# Patient Record
Sex: Male | Born: 1995 | Race: Black or African American | Hispanic: No | Marital: Single | Smoking: Current some day smoker
Health system: Southern US, Community
[De-identification: ages and names within clinical notes are randomized; demographics above are authoritative.]

## PROBLEM LIST (undated history)

## (undated) ENCOUNTER — Emergency Department: Admission: EM | Payer: Self-pay | Source: Home / Self Care

---
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
## 2015-05-19 ENCOUNTER — Emergency Department
Admission: EM | Admit: 2015-05-19 | Discharge: 2015-05-19 | Disposition: A | Discharge status: Home / Self Care | Payer: Self-pay | Attending: Emergency Medicine | Admitting: Emergency Medicine

## 2015-05-19 ENCOUNTER — Encounter: Payer: Self-pay | Admitting: *Deleted

## 2015-05-19 DIAGNOSIS — Z720 Tobacco use: Secondary | ICD-10-CM | POA: Insufficient documentation

## 2015-05-19 DIAGNOSIS — Z880 Allergy status to penicillin: Secondary | ICD-10-CM | POA: Insufficient documentation

## 2015-05-19 DIAGNOSIS — K529 Noninfective gastroenteritis and colitis, unspecified: Secondary | ICD-10-CM | POA: Insufficient documentation

## 2015-05-19 LAB — URINALYSIS COMPLETE WITH MICROSCOPIC (ARMC ONLY)
Bacteria, UA: NONE SEEN
Bilirubin Urine: NEGATIVE
GLUCOSE, UA: NEGATIVE mg/dL
HGB URINE DIPSTICK: NEGATIVE
KETONES UR: NEGATIVE mg/dL
Leukocytes, UA: NEGATIVE
NITRITE: NEGATIVE
Protein, ur: NEGATIVE mg/dL
SPECIFIC GRAVITY, URINE: 1.011 (ref 1.005–1.030)
Squamous Epithelial / LPF: NONE SEEN
WBC, UA: NONE SEEN WBC/hpf (ref 0–5)
pH: 8 (ref 5.0–8.0)

## 2015-05-19 NOTE — Discharge Instructions (Signed)
Viral Gastroenteritis Viral gastroenteritis is also called stomach flu. This illness is caused by a certain type of germ (virus). It can cause sudden watery poop (diarrhea) and throwing up (vomiting). This can cause you to lose body fluids (dehydration). This illness usually lasts for 3 to 8 days. It usually goes away on its own. HOME CARE   Drink enough fluids to keep your pee (urine) clear or pale yellow. Drink small amounts of fluids often.  Ask your doctor how to replace body fluid losses (rehydration).  Avoid:  Foods high in sugar.  Alcohol.  Bubbly (carbonated) drinks.  Tobacco.  Juice.  Caffeine drinks.  Very hot or cold fluids.  Fatty, greasy foods.  Eating too much at one time.  Dairy products until 24 to 48 hours after your watery poop stops.  You may eat foods with active cultures (probiotics). They can be found in some yogurts and supplements.  Wash your hands well to avoid spreading the illness.  Only take medicines as told by your doctor. Do not give aspirin to children. Do not take medicines for watery poop (antidiarrheals).  Ask your doctor if you should keep taking your regular medicines.  Keep all doctor visits as told. GET HELP RIGHT AWAY IF:   You cannot keep fluids down.  You do not pee at least once every 6 to 8 hours.  You are short of breath.  You see blood in your poop or throw up. This may look like coffee grounds.  You have belly (abdominal) pain that gets worse or is just in one small spot (localized).  You keep throwing up or having watery poop.  You have a fever.  The patient is a child younger than 3 months, and he or she has a fever.  The patient is a child older than 3 months, and he or she has a fever and problems that do not go away.  The patient is a child older than 3 months, and he or she has a fever and problems that suddenly get worse.  The patient is a baby, and he or she has no tears when crying. MAKE SURE YOU:     Understand these instructions.  Will watch your condition.  Will get help right away if you are not doing well or get worse. Document Released: 02/18/2008 Document Revised: 11/24/2011 Document Reviewed: 06/18/2011 [facility] Patient Information 2015 [place], [place]. This information is not intended to replace advice given to you by your health care provider. Make sure you discuss any questions you have with your health care provider.  Your exam and labs were normal today. Continue to monitor symptoms. Increase fluid intake to prevent dehydration.  Follow-up with the Health Department as needed.

## 2015-05-19 NOTE — ED Notes (Signed)
Patient states for last couple of days "I just haven't felt like myself, like I've been weak". Patient also reports lower back pain. States a few days ago [facility] and told them about some bladder area pain he was having. States he was tested for gonorrhea which was negative.

## 2015-05-20 NOTE — ED Provider Notes (Signed)
[facility] Emergency Department Provider Note ____________________________________________  Time seen: 1205  I have reviewed the triage vital signs and the nursing notes.  HISTORY  Chief Complaint  Weakness and Back Pain  HPI Brian Vega is a 19 y.o. male report to the ED with generalized complaints of malaise and fatigue. He states, "I just haven't felt like myself, like I've been weak." He denies any frank fevers, but does note over the last 2 days he's had decreased appetite, nausea without vomiting, as well as intermittent diarrhea, gas pain and general abdominal pain. He is unaware of any sick contacts, and denies recent travel or bad food. He reports negative gonorrhea test 2 weeks prior through the Health Department due to unrelated symptoms.  History reviewed. No pertinent past medical history.  There are no active problems to display for this patient.  History reviewed. No pertinent past surgical history.  No current outpatient prescriptions on file.  Allergies Amoxicillin  No family history on file.  Social History Social History  Substance Use Topics  . Smoking status: Current Some Day Smoker  . Smokeless tobacco: None  . Alcohol Use: No   Review of Systems  Constitutional: Negative for fever. Reports malaise as above. Eyes: Negative for visual changes. ENT: Negative for sore throat. Cardiovascular: Negative for chest pain. Respiratory: Negative for shortness of breath. Gastrointestinal: Positive for abdominal pain, vomiting and diarrhea. Genitourinary: Negative for dysuria. Musculoskeletal: Negative for back pain. Skin: Negative for rash. Neurological: Negative for headaches, focal weakness or numbness. ____________________________________________  PHYSICAL EXAM:  VITAL SIGNS: ED Triage Vitals  Enc Vitals Group     BP 05/19/15 1119 145/74 mmHg     Pulse Rate 05/19/15 1119 57     Resp 05/19/15 1119 17     Temp 05/19/15  1119 97.4 F (36.3 C)     Temp Source 05/19/15 1119 Oral     SpO2 05/19/15 1119 100 %     Weight 05/19/15 1119 145 lb (65.772 kg)     Height 05/19/15 1119 5\' 4"  (1.626 m)     Head Cir --      Peak Flow --      Pain Score 05/19/15 1120 5     Pain Loc --      Pain Edu? --      Excl. in GC? --    Constitutional: Alert and oriented. Well appearing and in no distress. Eyes: Conjunctivae are normal. PERRL. Normal extraocular movements. ENT   Head: Normocephalic and atraumatic.   Nose: No congestion/rhinnorhea.   Mouth/Throat: Mucous membranes are moist.   Neck: Supple. No thyromegaly. Hematological/Lymphatic/Immunilogical: No cervical lymphadenopathy. Cardiovascular: Normal rate, regular rhythm.  Respiratory: Normal respiratory effort. No wheezes/rales/rhonchi. Gastrointestinal: Soft and nontender. No distention, rebound, guarding, or organomegaly. GU: Normal circumcised male genitalia. Minimal left spermatic cord tenderness without edema, hernia, or testicular swelling.  Musculoskeletal: Nontender with normal range of motion in all extremities.  Neurologic:  Normal gait without ataxia. Normal speech and language. No gross focal neurologic deficits are appreciated. Skin:  Skin is warm, dry and intact. No rash noted. Psychiatric: Mood and affect are normal. Patient exhibits appropriate insight and judgment. ____________________________________________    LABS (pertinent positives/negatives) Labs Reviewed  URINALYSIS COMPLETEWITH MICROSCOPIC ([facility] ONLY) - Abnormal; Notable for the following:    Color, Urine YELLOW (*)    APPearance CLEAR (*)    All other components within normal limits  ____________________________________________  INITIAL IMPRESSION / ASSESSMENT AND PLAN / ED COURSE  Variable GI symptoms consistent with a likely viral gastroenteritis. Suggest patient continue to monitor symptoms, increased fluid intake, and rest as necessary. Suggest follow-up with the  [place] contacted him for ongoing symptoms or return to the ED for worsening symptoms. ____________________________________________  FINAL CLINICAL IMPRESSION(S) / ED DIAGNOSES  Final diagnoses:  Gastroenteritis, acute     Lissa Hoard, PA-C 05/20/15 1022  Jennye Moccasin, MD 05/23/15 [phone]

## 2019-07-09 ENCOUNTER — Other Ambulatory Visit: Payer: Self-pay

## 2019-07-09 DIAGNOSIS — H109 Unspecified conjunctivitis: Secondary | ICD-10-CM | POA: Insufficient documentation

## 2019-07-09 DIAGNOSIS — F17210 Nicotine dependence, cigarettes, uncomplicated: Secondary | ICD-10-CM | POA: Insufficient documentation

## 2019-07-09 NOTE — ED Notes (Signed)
Left eye:  20/25 Right eye:  20/40 Both  Eye:  20/30

## 2019-07-09 NOTE — ED Triage Notes (Signed)
Patient reports right eye pain for 3 days.  Sclera is noted to be red in right eye.

## 2019-07-10 ENCOUNTER — Emergency Department
Admission: EM | Admit: 2019-07-10 | Discharge: 2019-07-10 | Disposition: A | Discharge status: Home / Self Care | Payer: Self-pay | Attending: Emergency Medicine | Admitting: Emergency Medicine

## 2019-07-10 DIAGNOSIS — H109 Unspecified conjunctivitis: Secondary | ICD-10-CM

## 2019-07-10 MED ORDER — ERYTHROMYCIN 5 MG/GM OP OINT: 1.0000 "application " | TOPICAL_OINTMENT | Freq: Three times a day (TID) | OPHTHALMIC | 1 refills | Status: AC

## 2019-07-10 MED ORDER — ERYTHROMYCIN 5 MG/GM OP OINT
TOPICAL_OINTMENT | Freq: Once | OPHTHALMIC | Status: AC
  Administered 2019-07-10: 02:00:00 via OPHTHALMIC
  Filled 2019-07-10: qty 1

## 2019-07-10 NOTE — ED Notes (Signed)
ED Provider at bedside. 

## 2019-07-10 NOTE — ED Notes (Signed)
Patient with redness to right eye times three days.

## 2019-07-10 NOTE — ED Provider Notes (Signed)
[facility] Emergency Department Provider Note  Time seen: 1:55 AM  I have reviewed the triage vital signs and the nursing notes.   HISTORY  Chief Complaint Eye Problem   HPI Brian Vega is a 23 y.o. male with no past medical history who presents to the emergency department for redness and itching of the right eye.  According to the patient since this morning he has been noticing tearing/discharge with redness and itching of the right eye.  Denies any pain in the eye.  Denies any decreased vision.  Denies any trauma or foreign body to the eye.  Denies any fever cough or shortness of breath.   No past medical history on file.  There are no active problems to display for this patient.   No past surgical history on file.  Prior to Admission medications   Medication Sig Start Date End Date Taking? Authorizing Provider  erythromycin ophthalmic ointment Place 1 application into the right eye 3 (three) times daily for 7 days. 07/10/19 07/17/19  Harvest Dark, MD    Allergies  Allergen Reactions  . Amoxicillin Rash    No family history on file.  Social History Social History   Tobacco Use  . Smoking status: Current Some Day Smoker  Substance Use Topics  . Alcohol use: No  . Drug use: Yes    Types: Marijuana    Review of Systems Constitutional: Negative for fever. Eyes: Redness and itching of the right eye Cardiovascular: Negative for chest pain. Respiratory: Negative for shortness of breath. Musculoskeletal: Negative for musculoskeletal complaints Skin: Negative for skin complaints  Neurological: Negative for headache All other ROS negative  ____________________________________________   PHYSICAL EXAM:  VITAL SIGNS: ED Triage Vitals  Enc Vitals Group     BP 07/09/19 2306 114/79     Pulse Rate 07/09/19 2306 82     Resp --      Temp 07/09/19 2306 98.2 F (36.8 C)     Temp Source 07/09/19 2306 Oral     SpO2 07/09/19 2306 97 %      Weight 07/09/19 2306 145 lb (65.8 kg)     Height 07/09/19 2306 5\' 5"  (1.651 m)     Head Circumference --      Peak Flow --      Pain Score 07/09/19 2311 0     Pain Loc --      Pain Edu? --      Excl. in [place]? --     Constitutional: Alert and oriented. Well appearing and in no distress. Eyes: Patient has scleral injection of the right eye with mild clear tearing.  I everted eyelids, no foreign bodies.  Extraocular muscles intact, PERRL. ENT      Head: Normocephalic and atraumatic.      Mouth/Throat: Mucous membranes are moist. Cardiovascular: Normal rate, regular rhythm. No murmur Respiratory: Normal respiratory effort without tachypnea nor retractions. Breath sounds are clear Gastrointestinal: Soft and nontender. Musculoskeletal: Ambulates well. Neurologic:  Normal speech and language. No gross focal neurologic deficits Skin:  Skin is warm Psychiatric: Mood and affect are normal.  ____________________________________________   INITIAL IMPRESSION / ASSESSMENT AND PLAN / ED COURSE  Pertinent labs & imaging results that were available during my care of the patient were reviewed by me and considered in my medical decision making (see chart for details).   Patient presents with redness tearing and itching of the right eye.  Exam most consistent with conjunctivitis.  No pain.  Exam otherwise normal with EOMI, PERRL.  No visual changes.  We will place the patient on erythromycin ointment.  Patient will be discharged from the emergency department with the same.  Patient agreeable to plan of care.  Discussed return precautions.   Brian Vega was evaluated in Emergency Department on 07/10/2019 for the symptoms described in the history of present illness. He was evaluated in the context of the global COVID-19 pandemic, which necessitated consideration that the patient might be at risk for infection with the SARS-CoV-2 virus that causes COVID-19. Institutional protocols and algorithms  that pertain to the evaluation of patients at risk for COVID-19 are in a state of rapid change based on information released by regulatory bodies including the CDC and federal and state organizations. These policies and algorithms were followed during the patient's care in the ED.  ____________________________________________   FINAL CLINICAL IMPRESSION(S) / ED DIAGNOSES  Conjunctivitis   Minna Antis, MD 07/10/19 0157

## 2020-09-26 ENCOUNTER — Emergency Department
Admission: EM | Admit: 2020-09-26 | Discharge: 2020-09-26 | Disposition: A | Discharge status: Home / Self Care | Payer: Self-pay | Attending: Emergency Medicine | Admitting: Emergency Medicine

## 2020-09-26 ENCOUNTER — Other Ambulatory Visit: Payer: Self-pay

## 2020-09-26 ENCOUNTER — Encounter: Payer: Self-pay | Admitting: Emergency Medicine

## 2020-09-26 DIAGNOSIS — R112 Nausea with vomiting, unspecified: Secondary | ICD-10-CM

## 2020-09-26 DIAGNOSIS — F17200 Nicotine dependence, unspecified, uncomplicated: Secondary | ICD-10-CM | POA: Insufficient documentation

## 2020-09-26 DIAGNOSIS — R197 Diarrhea, unspecified: Secondary | ICD-10-CM | POA: Insufficient documentation

## 2020-09-26 LAB — URINALYSIS, COMPLETE (UACMP) WITH MICROSCOPIC
Bacteria, UA: NONE SEEN
Bilirubin Urine: NEGATIVE
Glucose, UA: NEGATIVE mg/dL
Hgb urine dipstick: NEGATIVE
Ketones, ur: NEGATIVE mg/dL
Leukocytes,Ua: NEGATIVE
Nitrite: NEGATIVE
Protein, ur: 30 mg/dL — AB
Specific Gravity, Urine: 1.028 (ref 1.005–1.030)
pH: 6 (ref 5.0–8.0)

## 2020-09-26 LAB — CBC
HCT: 47.4 % (ref 39.0–52.0)
Hemoglobin: 17.2 g/dL — ABNORMAL HIGH (ref 13.0–17.0)
MCH: 32.3 pg (ref 26.0–34.0)
MCHC: 36.3 g/dL — ABNORMAL HIGH (ref 30.0–36.0)
MCV: 88.9 fL (ref 80.0–100.0)
Platelets: 215 10*3/uL (ref 150–400)
RBC: 5.33 MIL/uL (ref 4.22–5.81)
RDW: 11.9 % (ref 11.5–15.5)
WBC: 6.1 10*3/uL (ref 4.0–10.5)
nRBC: 0 % (ref 0.0–0.2)

## 2020-09-26 LAB — COMPREHENSIVE METABOLIC PANEL
ALT: 26 U/L (ref 0–44)
AST: 26 U/L (ref 15–41)
Albumin: 4.4 g/dL (ref 3.5–5.0)
Alkaline Phosphatase: 81 U/L (ref 38–126)
Anion gap: 10 (ref 5–15)
BUN: 17 mg/dL (ref 6–20)
CO2: 27 mmol/L (ref 22–32)
Calcium: 9.2 mg/dL (ref 8.9–10.3)
Chloride: 100 mmol/L (ref 98–111)
Creatinine, Ser: 0.99 mg/dL (ref 0.61–1.24)
GFR, Estimated: >60 mL/min (ref >60)
Glucose, Bld: 111 mg/dL — ABNORMAL HIGH (ref 70–99)
Potassium: 3.9 mmol/L (ref 3.5–5.1)
Sodium: 137 mmol/L (ref 135–145)
Total Bilirubin: 1.4 mg/dL — ABNORMAL HIGH (ref 0.3–1.2)
Total Protein: 8.3 g/dL — ABNORMAL HIGH (ref 6.5–8.1)

## 2020-09-26 LAB — LIPASE, BLOOD: Lipase: 23 U/L (ref 11–51)

## 2020-09-26 MED ORDER — ONDANSETRON 4 MG PO TBDP: 4.0000 mg | ORAL_TABLET | Freq: Four times a day (QID) | ORAL | 0 refills | Status: DC | PRN

## 2020-09-26 NOTE — ED Notes (Signed)
Pt reports nausea, vomiting, and diarrhea x3 days. Pt reports symptoms began after eating a meal from [employer]. Pt reports multiple negative Covid test and is awaiting results of another repeat test from today. Pt reports no abdominal pain, nausea, or vomiting at present. No tenderness to abdomen. Pt given cup of ice water for fluid challenge.

## 2020-09-26 NOTE — Discharge Instructions (Signed)
Your labs and urine today were very reassuring.  You may alternate Tylenol 1000 mg every 6 hours as needed for pain, fever and Ibuprofen 800 mg every 8 hours as needed for pain, fever.  Please take Ibuprofen with food.  Do not take more than 4000 mg of Tylenol (acetaminophen) in a 24 hour period.  You may use over-the-counter Imodium as needed for diarrhea.

## 2020-09-26 NOTE — ED Provider Notes (Signed)
[facility] Emergency Department Provider Note   ____________________________________________   Event Date/Time   First MD Initiated Contact with Patient 09/26/20 [phone]     (approximate)  I have reviewed the triage vital signs and the nursing notes.   HISTORY  Chief Complaint Nausea, Vomiting, and Diarrhea    HPI Brian Vega is a 25 y.o. male with no significant past medical history who presents to the emergency department with decreased appetite, 1 episode of nonbloody, nonbilious vomiting and 1 episode of nonbloody diarrhea.  No current abdominal pain.  No dysuria, hematuria, discharge.  No previous abdominal surgery.  States he thinks he may have had food poisoning.  Symptoms started yesterday.   States he was tested for COVID-19 yesterday and these test results are pending.  No cough, chest pain, shortness of breath, sore throat.  States he does have myalgias.    History reviewed. No pertinent past medical history.  There are no problems to display for this patient.   History reviewed. No pertinent surgical history.  Prior to Admission medications   Not on File    Allergies Amoxicillin  History reviewed. No pertinent family history.  Social History Social History   Tobacco Use  . Smoking status: Current Some Day Smoker  . Smokeless tobacco: Never Used  Substance Use Topics  . Alcohol use: Yes  . Drug use: Yes    Types: Marijuana    Review of Systems  Constitutional: No fever/chills Eyes: No visual changes. ENT: No sore throat. Cardiovascular: Denies chest pain. Respiratory: Denies shortness of breath. Gastrointestinal: No abdominal pain.+ Nausea, vomiting and diarrhea.  No constipation. Genitourinary: Negative for dysuria. Musculoskeletal: Negative for back pain. Skin: Negative for rash. Neurological: Negative for headaches, focal weakness or numbness.   ____________________________________________   PHYSICAL  EXAM:  VITAL SIGNS: ED Triage Vitals  Enc Vitals Group     BP 09/26/20 0050 129/90     Pulse Rate 09/26/20 0050 92     Resp 09/26/20 0050 16     Temp 09/26/20 0050 98 F (36.7 C)     Temp Source 09/26/20 0050 Oral     SpO2 09/26/20 0050 100 %     Weight 09/26/20 0050 145 lb (65.8 kg)     Height 09/26/20 0050 5\' 5"  (1.651 m)     Head Circumference --      Peak Flow --      Pain Score 09/26/20 0051 6     Pain Loc --      Pain Edu? --      Excl. in GC? --     Constitutional: Alert and oriented. Well appearing and in no acute distress. Eyes: Conjunctivae are normal. PERRL. EOMI. Head: Atraumatic. Nose: No congestion/rhinnorhea. Mouth/Throat: Mucous membranes are moist.  Oropharynx non-erythematous. Neck: No stridor.   Cardiovascular: Normal rate, regular rhythm. Grossly normal heart sounds.  Good peripheral circulation. Respiratory: Normal respiratory effort.  No retractions. Lungs CTAB. Gastrointestinal: Soft and nontender. No distention. No abdominal bruits. No CVA tenderness. Musculoskeletal: No lower extremity tenderness nor edema.  No joint effusions.  No back tenderness, step-off or deformity. Neurologic:  Normal speech and language. No gross focal neurologic deficits are appreciated. No gait instability. Skin:  Skin is warm, dry and intact. No rash noted. Psychiatric: Mood and affect are normal. Speech and behavior are normal.  ____________________________________________   LABS (all labs ordered are listed, but only abnormal results are displayed)  Labs Reviewed  COMPREHENSIVE METABOLIC PANEL -  Abnormal; Notable for the following components:      Result Value   Glucose, Bld 111 (*)    Total Protein 8.3 (*)    Total Bilirubin 1.4 (*)    All other components within normal limits  CBC - Abnormal; Notable for the following components:   Hemoglobin 17.2 (*)    MCHC 36.3 (*)    All other components within normal limits  URINALYSIS, COMPLETE (UACMP) WITH MICROSCOPIC -  Abnormal; Notable for the following components:   Color, Urine YELLOW (*)    APPearance HAZY (*)    Protein, ur 30 (*)    All other components within normal limits  LIPASE, BLOOD   ____________________________________________  EKG  None ____________________________________________  RADIOLOGY  ED MD interpretation: None  Official radiology report(s): No results found.  ____________________________________________   PROCEDURES  Procedure(s) performed: None  Procedures  Critical Care performed: No  ____________________________________________   INITIAL IMPRESSION / ASSESSMENT AND PLAN / ED COURSE  As part of my medical decision making, I reviewed the following data within the electronic MEDICAL RECORD NUMBER Nursing notes reviewed and incorporated, Labs reviewed and show no significant abnormality and Notes from prior ED visits  Patient here with 1 episode of vomiting and 1 episode of diarrhea.  He is well-appearing at this time and his abdominal exam is benign.  Labs obtained in triage show no significant abnormality.  Urine shows no sign of infection, hematuria or ketones to suggest dehydration.  Discussed with patient that this could be food poisoning versus a viral illness.  He has an outpatient COVID test that he is awaiting results for.  Recommended Tylenol and Motrin for discomfort.  Recommended Imodium as needed for diarrhea.  Will discharge with prescription of Zofran.  Recommended bland diet for the next several days.  Well-appearing here.  Doubt appendicitis, colitis, diverticulitis, pancreatitis, colitis, bowel obstruction, UTI, pyelonephritis.      ____________________________________________   FINAL CLINICAL IMPRESSION(S) / ED DIAGNOSES  Final diagnoses:  Nausea vomiting and diarrhea     ED Discharge Orders         Ordered    ondansetron (ZOFRAN ODT) 4 MG disintegrating tablet  Every 6 hours PRN        09/26/20 0441           Note:  This document  was prepared using Dragon voice recognition software and may include unintentional dictation errors.    Lissie Hinesley, Layla Maw, DO 09/26/20 [phone]

## 2020-09-26 NOTE — ED Triage Notes (Signed)
Pt to ED from home c/o n/v/d x3 days.  States happened after eating at [employer].  Also c/o body aches, headaches.  Pt states had COVID tests done that were negative recently, waiting on a test done today.  Pt A&Ox4, chest rise even and unlabored, skin WNL, in NAD at this time.

## 2021-03-31 ENCOUNTER — Emergency Department
Admission: EM | Admit: 2021-03-31 | Discharge: 2021-03-31 | Disposition: A | Discharge status: Home / Self Care | Payer: Self-pay | Attending: Emergency Medicine | Admitting: Emergency Medicine

## 2021-03-31 ENCOUNTER — Other Ambulatory Visit: Payer: Self-pay

## 2021-03-31 DIAGNOSIS — K047 Periapical abscess without sinus: Secondary | ICD-10-CM | POA: Insufficient documentation

## 2021-03-31 DIAGNOSIS — F17200 Nicotine dependence, unspecified, uncomplicated: Secondary | ICD-10-CM | POA: Insufficient documentation

## 2021-03-31 MED ORDER — LIDOCAINE-EPINEPHRINE (PF) 2 %-1:200000 IJ SOLN
10.0000 mL | Freq: Once | INTRAMUSCULAR | Status: AC
  Administered 2021-03-31: 10 mL
  Filled 2021-03-31: qty 20

## 2021-03-31 MED ORDER — DOXYCYCLINE HYCLATE 100 MG PO TABS: 100.0000 mg | ORAL_TABLET | Freq: Two times a day (BID) | ORAL | 0 refills | Status: DC

## 2021-03-31 MED ORDER — HYDROCODONE-ACETAMINOPHEN 5-325 MG PO TABS: 1.0000 | ORAL_TABLET | ORAL | 0 refills | Status: AC | PRN

## 2021-03-31 MED ORDER — DOXYCYCLINE HYCLATE 100 MG PO TABS
100.0000 mg | ORAL_TABLET | Freq: Once | ORAL | Status: AC
  Administered 2021-03-31: 100 mg via ORAL
  Filled 2021-03-31: qty 1

## 2021-03-31 MED ORDER — OXYCODONE-ACETAMINOPHEN 5-325 MG PO TABS
1.0000 | ORAL_TABLET | Freq: Once | ORAL | Status: AC
  Administered 2021-03-31: 1 via ORAL
  Filled 2021-03-31: qty 1

## 2021-03-31 NOTE — ED Provider Notes (Signed)
[facility] Emergency Department Provider Note  ____________________________________________  Time seen: Approximately 7:50 PM  I have reviewed the triage vital signs and the nursing notes.   HISTORY  Chief Complaint Dental Pain    HPI Brian Vega is a 25 y.o. male who is complaining of right-sided facial pain that started off as dental pain to the right upper dentition.  Patient states that symptoms have been ongoing x2 weeks.  No fevers or chills.  He states that he put some sealant that he got for the tooth on which helped some with the pain but is still had ongoing pain.  He is scheduled to see his dentist in 2 days for further evaluation of tooth.  He states that the pain was getting to the point could not stand so he presents for evaluation today.       History reviewed. No pertinent past medical history.  There are no problems to display for this patient.   History reviewed. No pertinent surgical history.  Prior to Admission medications   Medication Sig Start Date End Date Taking? Authorizing Provider  doxycycline (VIBRA-TABS) 100 MG tablet Take 1 tablet (100 mg total) by mouth 2 (two) times daily. 03/31/21  Yes Sue Mcalexander, Delorise Royals, PA-C  HYDROcodone-acetaminophen (NORCO/VICODIN) 5-325 MG tablet Take 1 tablet by mouth every 4 (four) hours as needed for moderate pain. 03/31/21 03/31/22 Yes Ennifer Harston, Delorise Royals, PA-C  ondansetron (ZOFRAN ODT) 4 MG disintegrating tablet Take 1 tablet (4 mg total) by mouth every 6 (six) hours as needed for nausea or vomiting. 09/26/20   Ward, Layla Maw, DO    Allergies Amoxicillin  No family history on file.  Social History Social History   Tobacco Use   Smoking status: Some Days   Smokeless tobacco: Never  Substance Use Topics   Alcohol use: Yes   Drug use: Yes    Types: Marijuana     Review of Systems  Constitutional: No fever/chills Eyes: No visual changes. No discharge ENT: Positive for  right-sided dental pain Cardiovascular: no chest pain. Respiratory: no cough. No SOB. Gastrointestinal: No abdominal pain.  No nausea, no vomiting.  No diarrhea.  No constipation. Musculoskeletal: Negative for musculoskeletal pain. Skin: Negative for rash, abrasions, lacerations, ecchymosis. Neurological: Negative for headaches, focal weakness or numbness.  10 System ROS otherwise negative.  ____________________________________________   PHYSICAL EXAM:  VITAL SIGNS: ED Triage Vitals  Enc Vitals Group     BP 03/31/21 1857 113/70     Pulse Rate 03/31/21 1857 66     Resp 03/31/21 1857 18     Temp 03/31/21 1857 98 F (36.7 C)     Temp Source 03/31/21 1857 Oral     SpO2 03/31/21 1857 100 %     Weight --      Height --      Head Circumference --      Peak Flow --      Pain Score 03/31/21 1751 10     Pain Loc --      Pain Edu? --      Excl. in GC? --      Constitutional: Alert and oriented. Well appearing and in no acute distress. Eyes: Conjunctivae are normal. PERRL. EOMI. Head: Atraumatic. ENT:      Ears:       Nose: No congestion/rhinnorhea.      Mouth/Throat: Mucous membranes are moist.  Patient with a topical sealant over the first molar right upper dentition.  There is  no surrounding erythema or edema. Neck: No stridor.    Cardiovascular: Normal rate, regular rhythm. Normal S1 and S2.  Good peripheral circulation. Respiratory: Normal respiratory effort without tachypnea or retractions. Lungs CTAB. Good air entry to the bases with no decreased or absent breath sounds. Musculoskeletal: Full range of motion to all extremities. No gross deformities appreciated. Neurologic:  Normal speech and language. No gross focal neurologic deficits are appreciated.  Skin:  Skin is warm, dry and intact. No rash noted. Psychiatric: Mood and affect are normal. Speech and behavior are normal. Patient exhibits appropriate insight and  judgement.   ____________________________________________   LABS (all labs ordered are listed, but only abnormal results are displayed)  Labs Reviewed - No data to display ____________________________________________  EKG   ____________________________________________  RADIOLOGY   No results found.  ____________________________________________    PROCEDURES  Procedure(s) performed:    Procedures    Medications  oxyCODONE-acetaminophen (PERCOCET/ROXICET) 5-325 MG per tablet 1 tablet (has no administration in time range)  doxycycline (VIBRA-TABS) tablet 100 mg (100 mg Oral Given 03/31/21 1958)  lidocaine-EPINEPHrine (XYLOCAINE W/EPI) 2 %-1:200000 (PF) injection 10 mL (10 mLs Infiltration Given 03/31/21 1958)     ____________________________________________   INITIAL IMPRESSION / ASSESSMENT AND PLAN / ED COURSE  Pertinent labs & imaging results that were available during my care of the patient were reviewed by me and considered in my medical decision making (see chart for details).  Review of the [place] CSRS was performed in accordance of the [facility] prior to dispensing any controlled drugs.           Patient's diagnosis is consistent with dental infection.  Patient presents with 2-week history of right-sided facial/dental pain.  Started as dental pain patient impacted tooth with an over-the-counter sealant.  He is scheduled to see his dentist in 2 days.  Pain was worsening so he presents for evaluation.  No fevers or chills.  No difficulty breathing or swallowing.  Dental block for symptom relief.  Antibiotics for treatment of likely dental infection.  Follow-up with dentist in 2 days.. . Patient is given ED precautions to return to the ED for any worsening or new symptoms.     ____________________________________________  FINAL CLINICAL IMPRESSION(S) / ED DIAGNOSES  Final diagnoses:  Dental infection      NEW MEDICATIONS STARTED DURING THIS VISIT:  ED  Discharge Orders          Ordered    doxycycline (VIBRA-TABS) 100 MG tablet  2 times daily        03/31/21 2059    HYDROcodone-acetaminophen (NORCO/VICODIN) 5-325 MG tablet  Every 4 hours PRN        03/31/21 2059                This chart was dictated using voice recognition software/Dragon. Despite best efforts to proofread, errors can occur which can change the meaning. Any change was purely unintentional.    Racheal Patches, PA-C 03/31/21 2059    Jene Every, MD 03/31/21 2115

## 2021-03-31 NOTE — ED Triage Notes (Signed)
Pt comes with c/o dental pain for two weeks. Pt states pain to left sided of face.

## 2021-08-27 ENCOUNTER — Encounter: Payer: Self-pay | Admitting: *Deleted

## 2021-08-27 ENCOUNTER — Telehealth: Payer: Self-pay | Admitting: Emergency Medicine

## 2021-08-27 ENCOUNTER — Emergency Department
Admission: EM | Admit: 2021-08-27 | Discharge: 2021-08-27 | Disposition: A | Discharge status: Home / Self Care | Payer: Self-pay | Attending: Emergency Medicine | Admitting: Emergency Medicine

## 2021-08-27 DIAGNOSIS — Z202 Contact with and (suspected) exposure to infections with a predominantly sexual mode of transmission: Secondary | ICD-10-CM | POA: Insufficient documentation

## 2021-08-27 DIAGNOSIS — F17200 Nicotine dependence, unspecified, uncomplicated: Secondary | ICD-10-CM | POA: Insufficient documentation

## 2021-08-27 LAB — URINALYSIS, COMPLETE (UACMP) WITH MICROSCOPIC
Bacteria, UA: NONE SEEN
Bilirubin Urine: NEGATIVE
Glucose, UA: NEGATIVE mg/dL
Hgb urine dipstick: NEGATIVE
Ketones, ur: NEGATIVE mg/dL
Nitrite: NEGATIVE
Protein, ur: NEGATIVE mg/dL
Specific Gravity, Urine: 1.027 (ref 1.005–1.030)
Squamous Epithelial / LPF: NONE SEEN (ref 0–5)
WBC, UA: >50 WBC/hpf — ABNORMAL HIGH (ref 0–5)
pH: 5 (ref 5.0–8.0)

## 2021-08-27 LAB — CHLAMYDIA/NGC RT PCR (ARMC ONLY)
Chlamydia Tr: NOT DETECTED
N gonorrhoeae: DETECTED — AB

## 2021-08-27 MED ORDER — CEFTRIAXONE SODIUM 1 G IJ SOLR
500.0000 mg | INTRAMUSCULAR | Status: DC
  Administered 2021-08-27: 500 mg via INTRAMUSCULAR
  Filled 2021-08-27: qty 10

## 2021-08-27 MED ORDER — DOXYCYCLINE MONOHYDRATE 100 MG PO TABS: 100.0000 mg | ORAL_TABLET | Freq: Two times a day (BID) | ORAL | 0 refills | Status: AC

## 2021-08-27 NOTE — Discharge Instructions (Signed)
We are treating prophylactic for STD. Take antibiotics.  Go to health department for HIV/syphilis testing. Return to ER for other concerns.

## 2021-08-27 NOTE — ED Triage Notes (Signed)
Pt has penile discharge for 3 days.  Pt reports pain with urination   pt alert  speech clear.

## 2021-08-27 NOTE — ED Provider Notes (Addendum)
[facility] Emergency Department Provider Note  ____________________________________________   None    (approximate)  I have reviewed the triage vital signs and the nursing notes.   HISTORY  Chief Complaint SEXUALLY TRANSMITTED DISEASE    HPI Brian Vega is a 25 y.o. male pt comes in with concern for std. Reports symptoms for 3 days of discharge from penis. NO pain in testicle/penis/fevers.  + sexual contact without condoms.     Medical: healthy  There are no problems to display for this patient.   No past surgical history on file.  Prior to Admission medications   Medication Sig Start Date End Date Taking? Authorizing Provider  doxycycline (ADOXA) 100 MG tablet Take 1 tablet (100 mg total) by mouth 2 (two) times daily for 7 days. 08/27/21 09/03/21 Yes Concha Se, MD  doxycycline (VIBRA-TABS) 100 MG tablet Take 1 tablet (100 mg total) by mouth 2 (two) times daily. 03/31/21   Cuthriell, Delorise Royals, PA-C  HYDROcodone-acetaminophen (NORCO/VICODIN) 5-325 MG tablet Take 1 tablet by mouth every 4 (four) hours as needed for moderate pain. 03/31/21 03/31/22  Cuthriell, Delorise Royals, PA-C  ondansetron (ZOFRAN ODT) 4 MG disintegrating tablet Take 1 tablet (4 mg total) by mouth every 6 (six) hours as needed for nausea or vomiting. 09/26/20   Ward, Layla Maw, DO    Allergies Amoxicillin  No family history on file.  Social History Social History   Tobacco Use   Smoking status: Some Days   Smokeless tobacco: Never  Substance Use Topics   Alcohol use: Yes   Drug use: Yes    Types: Marijuana      Review of Systems Constitutional: No fever/chills Eyes: No visual changes. ENT: No sore throat. Cardiovascular: Denies chest pain. Respiratory: Denies shortness of breath. Gastrointestinal: No abdominal pain.  No nausea, no vomiting.  No diarrhea.  No constipation. Genitourinary: + dysuria + discharge  Musculoskeletal: Negative for back pain. Skin:  Negative for rash. Neurological: Negative for headaches, focal weakness or numbness. All other ROS negative ____________________________________________   PHYSICAL EXAM:  VITAL SIGNS: ED Triage Vitals  Enc Vitals Group     BP 08/27/21 0214 139/72     Pulse Rate 08/27/21 0214 78     Resp 08/27/21 0214 18     Temp 08/27/21 0214 98.3 F (36.8 C)     Temp src --      SpO2 08/27/21 0214 98 %     Weight 08/27/21 0215 145 lb (65.8 kg)     Height 08/27/21 0215 5\' 5"  (1.651 m)     Head Circumference --      Peak Flow --      Pain Score 08/27/21 0214 5     Pain Loc --      Pain Edu? --      Excl. in GC? --     Constitutional: Alert and oriented. Well appearing and in no acute distress. Eyes: Conjunctivae are normal. EOMI. Head: Atraumatic. Nose: No congestion/rhinnorhea. Mouth/Throat: Mucous membranes are moist.   Neck: No stridor. Trachea Midline. FROM Cardiovascular: Normal rate, regular rhythm. Grossly normal heart sounds.  Good peripheral circulation. Respiratory: Normal respiratory effort.  No retractions. Lungs CTAB. Gastrointestinal: Soft and nontender. No distention. No abdominal bruits.  Musculoskeletal: No lower extremity tenderness nor edema.  No joint effusions. Neurologic:  Normal speech and language. No gross focal neurologic deficits are appreciated.  Skin:  Skin is warm, dry and intact. No rash noted. Psychiatric: Mood and  affect are normal. Speech and behavior are normal. GU: mild discharge from penis, no testicle pain or swelling.   ____________________________________________   LABS (all labs ordered are listed, but only abnormal results are displayed)  Labs Reviewed  CHLAMYDIA/NGC RT PCR ([facility] ONLY)            URINE CULTURE  URINALYSIS, COMPLETE (UACMP) WITH MICROSCOPIC   ____________________________________________  INITIAL IMPRESSION / ASSESSMENT AND PLAN / ED COURSE  Norval Morton Vega was evaluated in Emergency Department on 08/27/2021 for the  symptoms described in the history of present illness. He was evaluated in the context of the global COVID-19 pandemic, which necessitated consideration that the patient might be at risk for infection with the SARS-CoV-2 virus that causes COVID-19. Institutional protocols and algorithms that pertain to the evaluation of patients at risk for COVID-19 are in a state of rapid change based on information released by regulatory bodies including the CDC and federal and state organizations. These policies and algorithms were followed during the patient's care in the ED.     Mild discharge at penile tip. Pt does not want to wait for STD results. Wanting prophylatic rx.  Will f/u mychart. No eivdence or orchitis/epidymitis. UA to rule out UTI  Pt will go to health department for HIV testing/syphilis testing.  Iunderstands no sex for 10 days and to let partner know if positive results so they can get rx.   I discussed the provisional nature of ED diagnosis, the treatment so far, the ongoing plan of care, follow up appointments and return precautions with the patient and any family or support people present. They expressed understanding and agreed with the plan, discharged home.         ____________________________________________   FINAL CLINICAL IMPRESSION(S) / ED DIAGNOSES   Final diagnoses:  STD exposure      MEDICATIONS GIVEN DURING THIS VISIT:  Medications  cefTRIAXone (ROCEPHIN) injection 500 mg (500 mg Intramuscular Given 08/27/21 0227)     ED Discharge Orders          Ordered    doxycycline (ADOXA) 100 MG tablet  2 times daily        08/27/21 0221             Note:  This document was prepared using Dragon voice recognition software and may include unintentional dictation errors.    Concha Se, MD 08/27/21 1696    Concha Se, MD 08/27/21 0230

## 2021-08-27 NOTE — Telephone Encounter (Signed)
Called patient to inform of std results.  He was treated during ED visit.  Left message.   

## 2021-08-28 LAB — URINE CULTURE: Culture: NO GROWTH

## 2022-09-30 ENCOUNTER — Other Ambulatory Visit: Payer: Self-pay

## 2022-09-30 ENCOUNTER — Emergency Department
Admission: EM | Admit: 2022-09-30 | Discharge: 2022-09-30 | Disposition: A | Discharge status: Home / Self Care | Payer: Self-pay | Attending: Emergency Medicine | Admitting: Emergency Medicine

## 2022-09-30 DIAGNOSIS — R112 Nausea with vomiting, unspecified: Secondary | ICD-10-CM | POA: Insufficient documentation

## 2022-09-30 DIAGNOSIS — M549 Dorsalgia, unspecified: Secondary | ICD-10-CM

## 2022-09-30 DIAGNOSIS — M546 Pain in thoracic spine: Secondary | ICD-10-CM | POA: Insufficient documentation

## 2022-09-30 DIAGNOSIS — Z1152 Encounter for screening for COVID-19: Secondary | ICD-10-CM | POA: Insufficient documentation

## 2022-09-30 LAB — URINALYSIS, ROUTINE W REFLEX MICROSCOPIC
Bilirubin Urine: NEGATIVE
Glucose, UA: NEGATIVE mg/dL
Hgb urine dipstick: NEGATIVE
Ketones, ur: NEGATIVE mg/dL
Leukocytes,Ua: NEGATIVE
Nitrite: NEGATIVE
Protein, ur: NEGATIVE mg/dL
Specific Gravity, Urine: 1.027 (ref 1.005–1.030)
pH: 6 (ref 5.0–8.0)

## 2022-09-30 LAB — RESP PANEL BY RT-PCR (FLU A&B, COVID) ARPGX2
Influenza A by PCR: NEGATIVE
Influenza B by PCR: NEGATIVE
SARS Coronavirus 2 by RT PCR: NEGATIVE

## 2022-09-30 MED ORDER — ONDANSETRON 4 MG PO TBDP: 4.0000 mg | ORAL_TABLET | Freq: Three times a day (TID) | ORAL | 0 refills | Status: AC | PRN

## 2022-09-30 MED ORDER — BACLOFEN 10 MG PO TABS: 10.0000 mg | ORAL_TABLET | Freq: Three times a day (TID) | ORAL | 0 refills | Status: AC

## 2022-09-30 MED ORDER — IBUPROFEN 600 MG PO TABS
600.0000 mg | ORAL_TABLET | Freq: Once | ORAL | Status: AC
  Administered 2022-09-30: 600 mg via ORAL
  Filled 2022-09-30: qty 1

## 2022-09-30 MED ORDER — ONDANSETRON 4 MG PO TBDP
4.0000 mg | ORAL_TABLET | Freq: Once | ORAL | Status: AC
  Administered 2022-09-30: 4 mg via ORAL
  Filled 2022-09-30: qty 1

## 2022-09-30 NOTE — ED Notes (Signed)
See triage note  Presents with body aches hospital and vomited times 1 this am  No fever

## 2022-09-30 NOTE — ED Provider Triage Note (Signed)
Emergency Medicine Provider Triage Evaluation Note  Brian Vega , a 27 y.o. male  was evaluated in triage.  Pt complains of body aches, chills, vomited once. "Make sure I don't have the flu"  Review of Systems  Positive: Fever, chills, aches, nausea, vomited once Negative: Shortness of breath  Physical Exam  There were no vitals taken for this visit. Gen:   Awake, no distress   Resp:  Normal effort  MSK:   Moves extremities without difficulty  Other:  Abd soft, non-tender all quadrants  Medical Decision Making  Medically screening exam initiated at 10:20 AM.  Appropriate orders placed.  Brian Vega was informed that the remainder of the evaluation will be completed by another provider, this initial triage assessment does not replace that evaluation, and the importance of remaining in the ED until their evaluation is complete.  Patient agreeable with trial zofran formild nausea   Delman Kitten, MD 09/30/22 1021

## 2022-09-30 NOTE — Discharge Instructions (Signed)
Take Tylenol and ibuprofen.  Follow-up with your regular doctor if not improving to 3 days.  Return if worsening.

## 2022-09-30 NOTE — ED Triage Notes (Signed)
Pt to ED via POV from. Pt reports HA, N/V, cough and body aches for a few days.

## 2022-09-30 NOTE — ED Provider Notes (Signed)
[facility] Provider Note    Event Date/Time   First MD Initiated Contact with Patient 09/30/22 1027     (approximate)   History   Generalized Body Aches   HPI  Brian Vega is a 27 y.o. male with no significant past medical history presents with headache, nausea vomiting, cough and bodyaches for few days.  States his back is been sore.  Did not like he can go to work yesterday or today.  Denies abdominal pain.  No known injuries.  No fever but did have chills      Physical Exam   Triage Vital Signs: ED Triage Vitals  Enc Vitals Group     BP 09/30/22 1020 (!) 126/91     Pulse Rate 09/30/22 1020 76     Resp 09/30/22 1020 18     Temp 09/30/22 1020 98.4 F (36.9 C)     Temp Source 09/30/22 1020 Oral     SpO2 09/30/22 1020 97 %     Weight 09/30/22 1021 160 lb (72.6 kg)     Height 09/30/22 1021 5\' 6"  (1.676 m)     Head Circumference --      Peak Flow --      Pain Score 09/30/22 1016 4     Pain Loc --      Pain Edu? --      Excl. in [place]? --     Most recent vital signs: Vitals:   09/30/22 1020  BP: (!) 126/91  Pulse: 76  Resp: 18  Temp: 98.4 F (36.9 C)  SpO2: 97%     General: Awake, no distress.   CV:  Good peripheral perfusion. regular rate and  rhythm Resp:  Normal effort. Lungs cta Abd:  No distention.  Nontender Other:  Muscles in the upper back are tender and spasms, neurovascular is intact, patient has full range of motion and able to ambulate without difficulty   ED Results / Procedures / Treatments   Labs (all labs ordered are listed, but only abnormal results are displayed) Labs Reviewed  URINALYSIS, ROUTINE W REFLEX MICROSCOPIC - Abnormal; Notable for the following components:      Result Value   Color, Urine YELLOW (*)    APPearance HAZY (*)    All other components within normal limits  RESP PANEL BY RT-PCR (FLU A&B, COVID) [id number]      EKG     RADIOLOGY     PROCEDURES:   Procedures   MEDICATIONS ORDERED IN ED: Medications  ondansetron (ZOFRAN-ODT) disintegrating tablet 4 mg (4 mg Oral Given 09/30/22 1036)  ibuprofen (ADVIL) tablet 600 mg (600 mg Oral Given 09/30/22 1102)     IMPRESSION / MDM / [place] / ED COURSE  I reviewed the triage vital signs and the nursing notes.                              Differential diagnosis includes, but is not limited to, COVID, influenza, muscle strain, viral illness  Patient's presentation is most consistent with acute complicated illness / injury requiring diagnostic workup.   Respiratory panel was normal, UA is reassuring he does not appear to be dehydrated  I did explain findings to the patient.  Is feeling better after the Zofran ODT and ibuprofen.  Will give him a prescription for baclofen for the muscle spasms, Zofran ODT if he begins to have nausea vomiting.  Patient is  in agreement treatment plan.  Discharged stable condition with a work note.      FINAL CLINICAL IMPRESSION(S) / ED DIAGNOSES   Final diagnoses:  Acute back pain less than 4 weeks duration  Nausea and vomiting, unspecified vomiting type     Rx / DC Orders   ED Discharge Orders          Ordered    ondansetron (ZOFRAN-ODT) 4 MG disintegrating tablet  Every 8 hours PRN        09/30/22 1132    baclofen (LIORESAL) 10 MG tablet  3 times daily        09/30/22 1132             Note:  This document was prepared using Dragon voice recognition software and may include unintentional dictation errors.    Versie Starks, PA-C 09/30/22 1136    Lucillie Garfinkel, MD 10/01/22 [phone]
